# Patient Record
Sex: Female | Born: 1995 | Race: Black or African American | Hispanic: No | Marital: Single | Smoking: Never smoker
Health system: Southern US, Community
[De-identification: ages and names within clinical notes are randomized; demographics above are authoritative.]

## PROBLEM LIST (undated history)

## (undated) HISTORY — PX: ANTERIOR CRUCIATE LIGAMENT REPAIR: SHX115

---

## 2020-03-08 ENCOUNTER — Ambulatory Visit: Payer: Self-pay | Attending: Family

## 2020-03-08 DIAGNOSIS — Z23 Encounter for immunization: Secondary | ICD-10-CM

## 2020-03-08 NOTE — Progress Notes (Signed)
   Cov[facility] Vaccination [facility]  Name:  Alyssa Ramsey    MRN: [id number] DOB: Apr 19, 1996  03/08/2020  [name] was observed post Covid-19 immunization for 15 minutes without incident. She was provided with Vaccine Information Sheet and instruction to access the V-Safe system.   Ms. Vessel was instructed to call 911 with any severe reactions post vaccine: Marland Kitchen Difficulty breathing  . Swelling of face and throat  . A fast heartbeat  . A bad rash all over body  . Dizziness and weakness   Immunizations Administered    Name Date Dose VIS Date Route   Moderna COVID-19 Vaccine 03/08/2020 12:29 PM 0.5 mL 11/15/2019 Intramuscular   Manufacturer: Moderna   Lot: [id number]   NDC: 86754-492-01

## 2020-04-10 ENCOUNTER — Ambulatory Visit: Payer: Self-pay | Attending: Internal Medicine

## 2020-08-12 ENCOUNTER — Encounter (HOSPITAL_COMMUNITY): Payer: Self-pay | Admitting: Emergency Medicine

## 2020-08-12 ENCOUNTER — Other Ambulatory Visit: Payer: Self-pay

## 2020-08-12 ENCOUNTER — Ambulatory Visit (HOSPITAL_COMMUNITY)
Admission: EM | Admit: 2020-08-12 | Discharge: 2020-08-12 | Disposition: A | Discharge status: Home / Self Care | Payer: BC Managed Care – PPO | Attending: Family Medicine | Admitting: Family Medicine

## 2020-08-12 ENCOUNTER — Ambulatory Visit (INDEPENDENT_AMBULATORY_CARE_PROVIDER_SITE_OTHER): Payer: BC Managed Care – PPO

## 2020-08-12 DIAGNOSIS — M79662 Pain in left lower leg: Secondary | ICD-10-CM

## 2020-08-12 DIAGNOSIS — R030 Elevated blood-pressure reading, without diagnosis of hypertension: Secondary | ICD-10-CM

## 2020-08-12 DIAGNOSIS — S8012XA Contusion of left lower leg, initial encounter: Secondary | ICD-10-CM | POA: Diagnosis not present

## 2020-08-12 NOTE — Discharge Instructions (Addendum)
Ice to area will help reduce the pain and swelling Take ibuprofen as needed for pain, may take 800 mg 3 times a day with food Tenderness in this area may persist for 3 to 4 weeks  Your blood pressure in March was 140/95.  Your blood pressure today is 140/100.  This is an important medical issue to follow-up on with your primary care doctor.

## 2020-08-12 NOTE — ED Provider Notes (Signed)
[facility]    CSN: [id number] Arrival date & time: 08/12/20  1718      History   Chief Complaint Chief Complaint  Patient presents with  . Fall  . Leg Pain    HPI Alyssa Ramsey is a 24 y.o. female.   HPI Patient is here for leg pain.  She fell in the shower today.  She states that she slipped in her left shin hit forcibly across the edge of the tub.  It was immediately severely painful.  She states that she had to sit down and catch her breath before she could get up and finish her bathing. She can put weight on it.  It is starting to swell and bruise.  It still pretty painful. Patient states she is in good health.  On no medicines.  College student History reviewed. No pertinent past medical history.  There are no problems to display for this patient.   Past Surgical History:  Procedure Laterality Date  . ANTERIOR CRUCIATE LIGAMENT REPAIR      OB History   No obstetric history on file.      Home Medications    Prior to Admission medications   Not on File    Family History Family History  Problem Relation Age of Onset  . Healthy Mother   . Hypertension Father     Social History Social History   Tobacco Use  . Smoking status: Never Smoker  . Smokeless tobacco: Never Used  Vaping Use  . Vaping Use: Never used  Substance Use Topics  . Alcohol use: Yes    Comment: occaionally  . Drug use: Never     Allergies   Patient has no known allergies.   Review of Systems Review of Systems See HPI  Physical Exam Triage Vital Signs ED Triage Vitals [08/12/20 1743]  Enc Vitals Group     BP (!) 142/100     Pulse Rate (!) 106     Resp (!) 21     Temp 97.6 F (36.4 C)     Temp Source Oral     SpO2 100 %     Weight      Height      Head Circumference      Peak Flow      Pain Score      Pain Loc      Pain Edu?      Excl. in GC?    No data found.  Updated Vital Signs BP (!) 142/100 (BP Location: Left Arm)   Pulse (!) 106   Temp  97.6 F (36.4 C) (Oral)   Resp (!) 21   LMP 08/05/2020   SpO2 100%      Physical Exam Constitutional:      General: She is not in acute distress.    Appearance: She is well-developed.  HENT:     Head: Normocephalic and atraumatic.     Mouth/Throat:     Comments: Mask is in place Eyes:     Conjunctiva/sclera: Conjunctivae normal.     Pupils: Pupils are equal, round, and reactive to light.  Cardiovascular:     Rate and Rhythm: Normal rate.  Pulmonary:     Effort: Pulmonary effort is normal. No respiratory distress.  Abdominal:     General: There is no distension.     Palpations: Abdomen is soft.  Musculoskeletal:        General: Normal range of motion.     Cervical back: Normal  range of motion.       Legs:  Skin:    General: Skin is warm and dry.  Neurological:     Mental Status: She is alert.     Gait: Gait abnormal.  Psychiatric:        Behavior: Behavior normal.      UC Treatments / Results  Labs (all labs ordered are listed, but only abnormal results are displayed) Labs Reviewed - No data to display  EKG   Radiology DG Tibia/Fibula Left  Result Date: 08/12/2020 CLINICAL DATA:  Pt c/o fall in the shower about 30 minutes ago and pain to left lower leg. the pain is just above the ankle. EXAM: LEFT TIBIA AND FIBULA - 2 VIEW COMPARISON:  None. FINDINGS: There is no evidence of fracture or other focal bone lesions. Soft tissues are unremarkable. IMPRESSION: Negative. Electronically Signed   By: Emmaline Kluver M.D.   On: 08/12/2020 18:11    Procedures Procedures (including critical care time)  Medications Ordered in UC Medications - No data to display  Initial Impression / Assessment and Plan / UC Course  I have reviewed the triage vital signs and the nursing notes.  Pertinent labs & imaging results that were available during my care of the patient were reviewed by me and considered in my medical decision making (see chart for details).      Final  Clinical Impressions(s) / UC Diagnoses   Final diagnoses:  Elevated blood pressure reading without diagnosis of hypertension  Contusion of left lower leg, initial encounter     Discharge Instructions     Ice to area will help reduce the pain and swelling Take ibuprofen as needed for pain, may take 800 mg 3 times a day with food Tenderness in this area may persist for 3 to 4 weeks  Your blood pressure in March was 140/95.  Your blood pressure today is 140/100.  This is an important medical issue to follow-up on with your primary care doctor.    ED Prescriptions    None     PDMP not reviewed this encounter.   Eustace Moore, MD 08/12/20 1911

## 2020-08-12 NOTE — ED Triage Notes (Signed)
Pt c/o fall in the shower about 30 minutes ago and pain to left lower leg.

## 2021-04-24 IMAGING — DX DG TIBIA/FIBULA 2V*L*
3 series · 3 of 3 positions shown · non-contrast
Comparison: None.

CLINICAL DATA: Pt c/o fall in the shower about 30 minutes ago and
pain to left lower leg. the pain is just above the ankle.

EXAM:
LEFT TIBIA AND FIBULA - 2 VIEW

[tibia ap (1 of 2)]
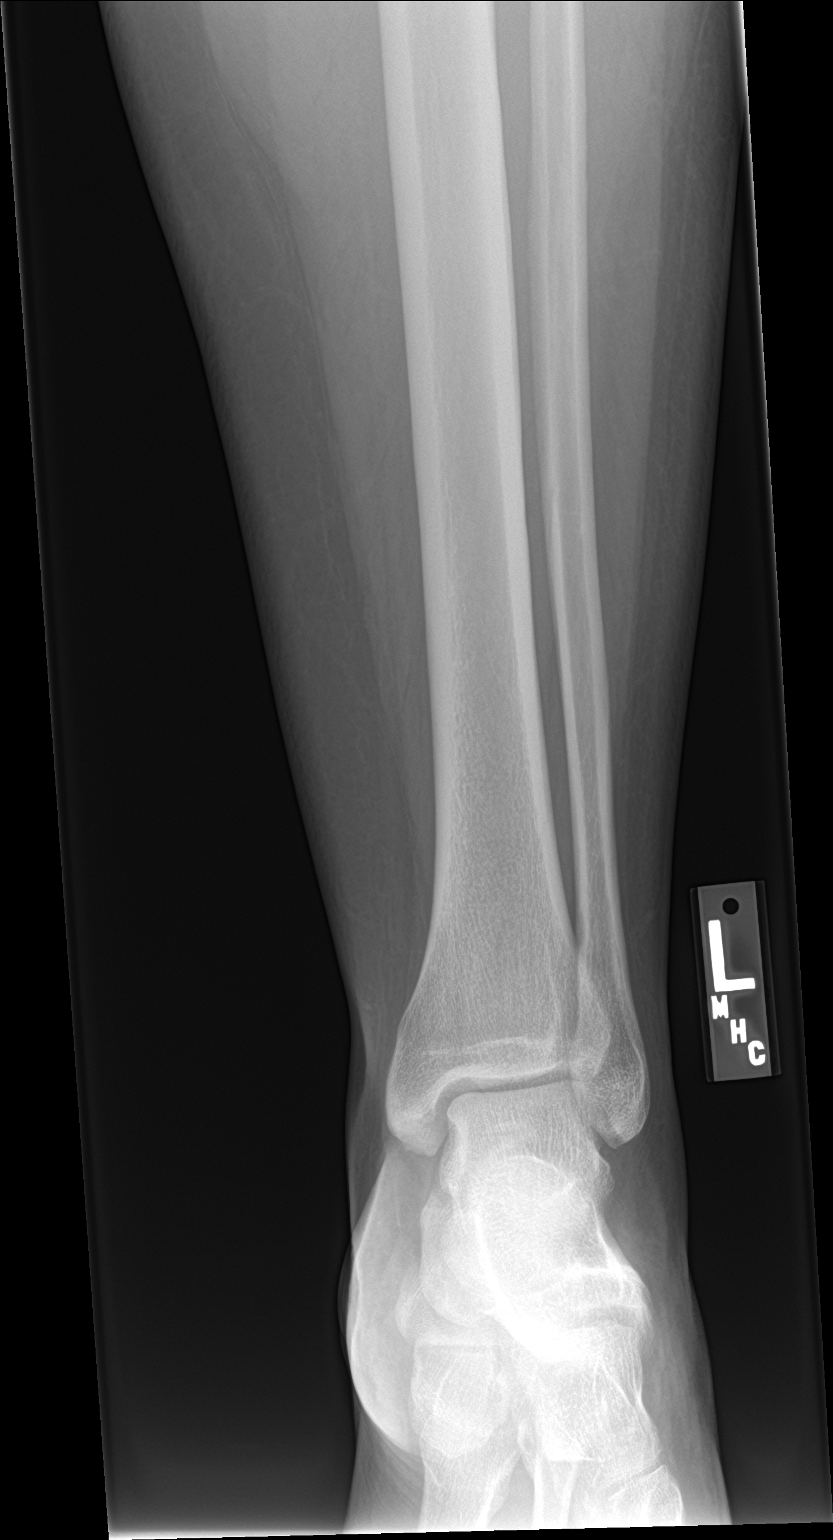

[tibia ap (2 of 2)]
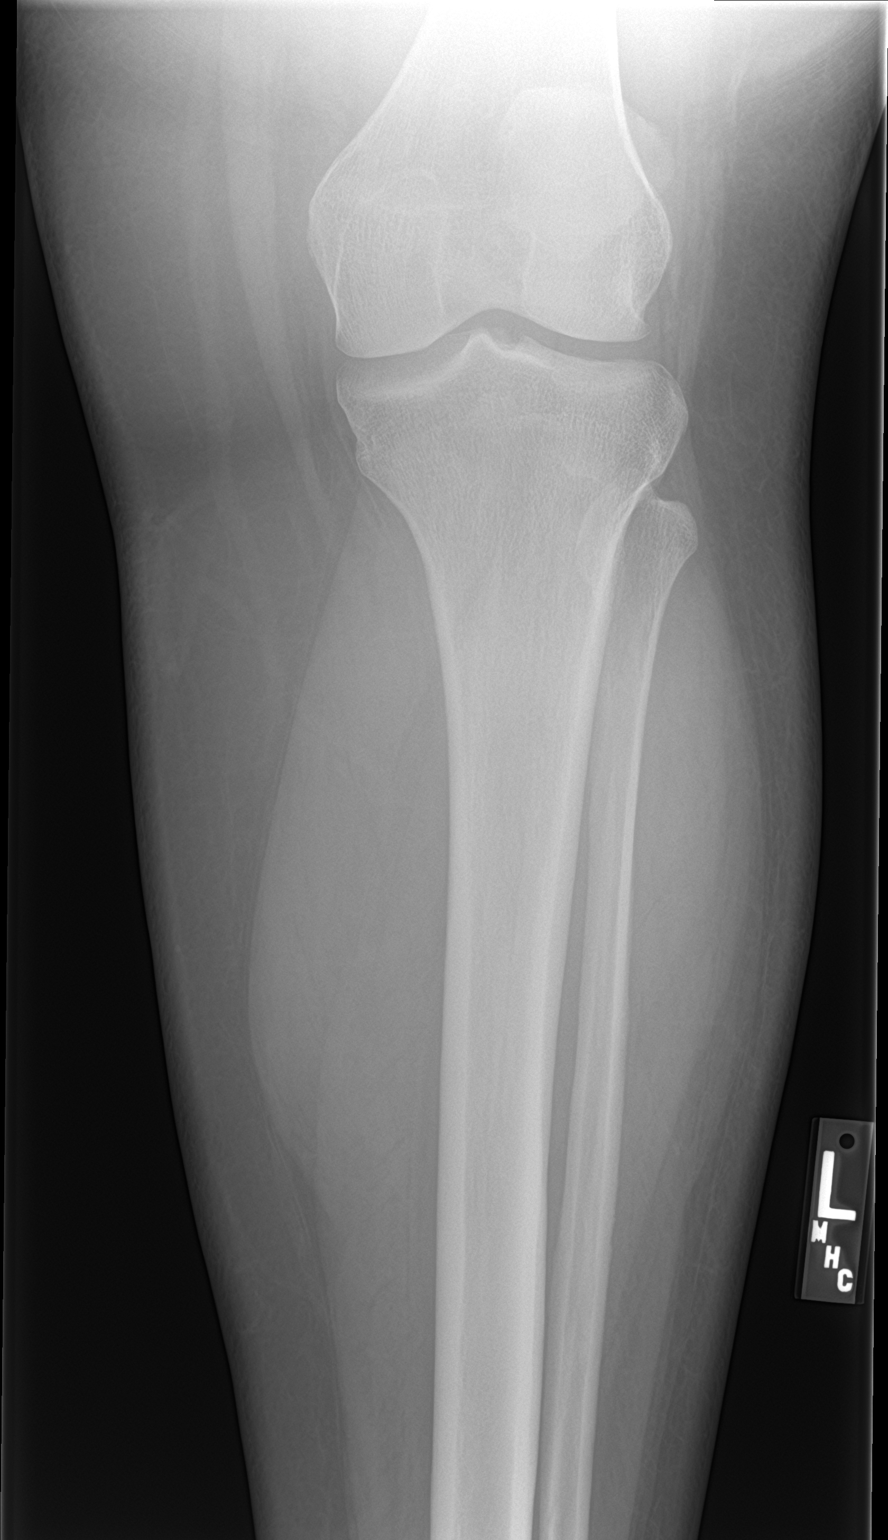

[tibia lat]
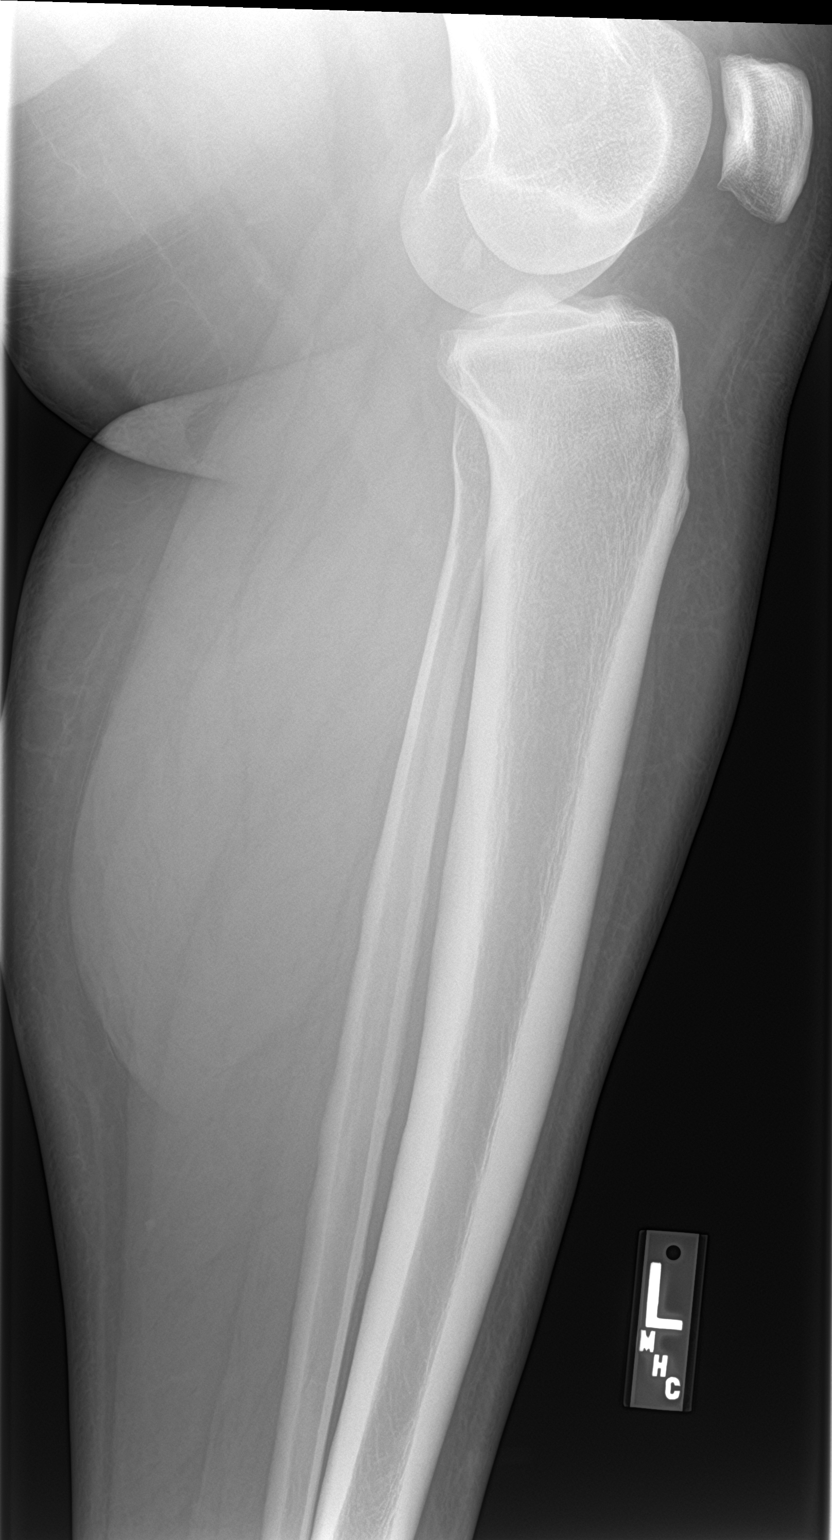

[3 of 3 positions shown; findings below may reference images not displayed]

FINDINGS: There is no evidence of fracture or other focal bone lesions. Soft
tissues are unremarkable.
IMPRESSION: Negative.
